# Patient Record
Sex: Female | Born: 1980 | Race: White | Hispanic: Yes | Marital: Married | Smoking: Former smoker
Health system: Southern US, Community
[De-identification: ages and names within clinical notes are randomized; demographics above are authoritative.]

## PROBLEM LIST (undated history)

## (undated) DIAGNOSIS — J45909 Unspecified asthma, uncomplicated: Secondary | ICD-10-CM

## (undated) DIAGNOSIS — Z9109 Other allergy status, other than to drugs and biological substances: Secondary | ICD-10-CM

## (undated) HISTORY — DX: Unspecified asthma, uncomplicated: J45.909

## (undated) HISTORY — DX: Other allergy status, other than to drugs and biological substances: Z91.09

---

## 2014-03-08 ENCOUNTER — Institutional Professional Consult (permissible substitution): Payer: Self-pay | Admitting: Internal Medicine

## 2014-04-08 ENCOUNTER — Institutional Professional Consult (permissible substitution): Payer: Medicare HMO | Admitting: Internal Medicine

## 2014-05-09 ENCOUNTER — Other Ambulatory Visit (INDEPENDENT_AMBULATORY_CARE_PROVIDER_SITE_OTHER): Payer: Medicare HMO

## 2014-05-09 ENCOUNTER — Ambulatory Visit (INDEPENDENT_AMBULATORY_CARE_PROVIDER_SITE_OTHER): Payer: Medicare HMO | Admitting: Internal Medicine

## 2014-05-09 ENCOUNTER — Encounter (INDEPENDENT_AMBULATORY_CARE_PROVIDER_SITE_OTHER): Payer: Self-pay

## 2014-05-09 VITALS — BP 120/76 | HR 71 | Temp 98.4°F | Ht 66.0 in | Wt 211.8 lb

## 2014-05-09 DIAGNOSIS — J4530 Mild persistent asthma, uncomplicated: Secondary | ICD-10-CM

## 2014-05-09 DIAGNOSIS — R635 Abnormal weight gain: Secondary | ICD-10-CM

## 2014-05-09 DIAGNOSIS — J45909 Unspecified asthma, uncomplicated: Secondary | ICD-10-CM | POA: Insufficient documentation

## 2014-05-09 LAB — CBC WITH DIFFERENTIAL/PLATELET
Basophils Absolute: 0 10*3/uL (ref 0.0–0.1)
Basophils Relative: 0.6 % (ref 0.0–3.0)
EOS ABS: 0.1 10*3/uL (ref 0.0–0.7)
Eosinophils Relative: 1.8 % (ref 0.0–5.0)
HCT: 38.6 % (ref 36.0–46.0)
HEMOGLOBIN: 13 g/dL (ref 12.0–15.0)
Lymphocytes Relative: 33 % (ref 12.0–46.0)
Lymphs Abs: 2.1 10*3/uL (ref 0.7–4.0)
MCHC: 33.8 g/dL (ref 30.0–36.0)
MCV: 87 fl (ref 78.0–100.0)
MONO ABS: 0.4 10*3/uL (ref 0.1–1.0)
Monocytes Relative: 6.9 % (ref 3.0–12.0)
NEUTROS ABS: 3.7 10*3/uL (ref 1.4–7.7)
NEUTROS PCT: 57.7 % (ref 43.0–77.0)
Platelets: 238 10*3/uL (ref 150.0–400.0)
RBC: 4.43 Mil/uL (ref 3.87–5.11)
RDW: 13 % (ref 11.5–15.5)
WBC: 6.4 10*3/uL (ref 4.0–10.5)

## 2014-05-09 LAB — TSH: TSH: 1.26 u[IU]/mL (ref 0.35–4.50)

## 2014-05-09 MED ORDER — BUDESONIDE-FORMOTEROL FUMARATE 80-4.5 MCG/ACT IN AERO: INHALATION_SPRAY | RESPIRATORY_TRACT | Status: DC

## 2014-05-09 NOTE — Patient Instructions (Signed)
Try symbicort 80 2puffs each am and repeat in 12 hours if any symptoms at all  Work on inhaler technique:  relax and gently blow all the way out then take a nice smooth deep breath back in, triggering the inhaler at same time you start breathing in.  Hold for up to 5 seconds if you can.  Rinse and gargle with water when done     Weight control is simply a matter of calorie balance which needs to be tilted in your favor by eating less and exercising more.  To get the most out of exercise, you need to be continuously aware that you are short of breath, but never out of breath, for 30 minutes daily. As you improve, it will actually be easier for you to do the same amount of exercise  in  30 minutes so always push to the level where you are short of breath.  If this does not result in gradual weight reduction then I strongly recommend you see a nutritionist with a food diary x 2 weeks so that we can work out a negative calorie balance which is universally effective in steady weight loss programs.  Think of your calorie balance like you do your bank account where in this case you want the balance to go down so you must take in less calories than you burn up.  It's just that simple:  Hard to do, but easy to understand.  Good luck!   Please schedule a follow up visit in 3 months but call sooner if needed

## 2014-05-09 NOTE — Progress Notes (Signed)
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 with onset of asthma in 2012 assoc with exp to [employer] and pos allergy testing by  ent "allergic asthma" =allergic to [employer] per her hx > Dr Orson AloeHenderson eval rx clonazepam and symbicort 160 2 each am but self -referred 05/09/2014 to pulmonary clinic for asthma management no longer using clonazepam   05/09/2014 [facility]st [facility] office visit/ Erica Ortega re asthma well controlled and no need for rescue saba recently   Not limited by breathing from desired activities  But not aerobic and concerned re wt gain   No obvious day to day or daytime variabilty or assoc chronic cough or cp or chest tightness, subjective wheeze overt sinus or hb symptoms. No unusual exp hx or h/o childhood pna/ asthma or knowledge of premature birth.  Sleeping ok without nocturnal  or early am exacerbation  of respiratory  c/o's or need for noct saba. Also denies any obvious fluctuation of symptoms with weather or environmental changes or other aggravating or alleviating factors except as outlined above   Current Medications, Allergies, Complete Past Medical History, Past Surgical History, Family History, and Social History were reviewed in [facility] electronic medical record.  ROS  The following are not active complaints unless bolded sore throat, dysphagia, dental problems, itching, sneezing,  nasal congestion or excess/ purulent secretions, ear ache,   fever, chills, sweats, unintended wt loss, pleuritic or exertional cp, hemoptysis,  orthopnea pnd or leg swelling, presyncope, palpitations, heartburn, abdominal pain, anorexia, nausea, vomiting, diarrhea  or change in bowel or urinary habits, change in stools or urine, dysuria,hematuria,  rash, arthralgias, visual complaints, headache, numbness weakness or  ataxia or problems with walking or coordination,  change in mood/affect or memory.                Review of Systems     Objective:   Physical Exam     Wt Readings from Last 3 Encounters:  05/09/14 211 lb 12.8 oz (96.072 kg)       HEENT: nl dentition, turbinates, and orophanx. Nl external ear canals without cough reflex   NECK :  without JVD/Nodes/TM/ nl carotid upstrokes bilaterally   LUNGS: no acc muscle use, clear to A and P bilaterally without cough on insp or exp maneuvers   CV:  RRR  no s3 or murmur or increase in P2, no edema   ABD:  soft and nontender with nl excursion in the supine position. No bruits or organomegaly, bowel sounds nl  MS:  warm without deformities, calf tenderness, cyanosis or clubbing  SKIN: warm and dry without lesions    NEURO:  alert, approp, no deficits   Lab Results  Component Value Date   WBC 6.4 05/09/2014   HGB 13.0 05/09/2014   HCT 38.6 05/09/2014   MCV 87.0 05/09/2014   PLT 238.0 05/09/2014    Lab Results  Component Value Date   TSH 1.26 05/09/2014      Assessment:

## 2014-05-10 ENCOUNTER — Encounter: Payer: Self-pay | Admitting: Internal Medicine

## 2014-05-10 NOTE — Assessment & Plan Note (Signed)
Calorie balance issues reviewed in detail See instructions for specific recommendations which were reviewed directly with the patient who was given a copy with highlighter outlining the key components.  

## 2014-05-10 NOTE — Progress Notes (Signed)
Quick Note:  LMTCB ______ 

## 2014-05-10 NOTE — Assessment & Plan Note (Addendum)
All goals of chronic asthma control met including optimal function and elimination of symptoms with minimal need for rescue therapy.  Contingencies discussed in full including contacting this office immediately if not controlling the symptoms using the rule of two's.     rec step down next refill to symbicort 80 2bid  Note h/o anxiety overlapping with asthma very difficult to sort out by hx but for now do not rec clonazepam 

## 2014-05-16 ENCOUNTER — Telehealth: Payer: Self-pay | Admitting: Internal Medicine

## 2014-05-16 NOTE — Telephone Encounter (Signed)
Call patient : Studies are unremarkable, no change in recs  [facility]

## 2014-05-16 NOTE — Progress Notes (Signed)
Quick Note:  LMTCB ______ 

## 2014-06-20 ENCOUNTER — Telehealth: Payer: Self-pay | Admitting: Internal Medicine

## 2014-06-20 NOTE — Telephone Encounter (Signed)
lmomtcb x1 

## 2014-06-21 NOTE — Telephone Encounter (Signed)
lmtcb for pt.  

## 2014-06-22 NOTE — Telephone Encounter (Signed)
lmtcb for pt.   Rx for symbicort was sent to pharmacy on 05/09/14 with 12 refills.

## 2014-06-24 NOTE — Telephone Encounter (Signed)
lmtcb for pt Called work # and closed

## 2014-06-28 NOTE — Telephone Encounter (Signed)
Spoke with pt and she states she found a paper rx for Symbicort but she states insurance will now not cover this.  She will contact insurance to see what they will cover in place of this and call us back.

## 2014-08-01 ENCOUNTER — Telehealth: Payer: Self-pay | Admitting: Internal Medicine

## 2014-08-01 NOTE — Telephone Encounter (Signed)
We do not have any samples at this time. Pt is aware.

## 2014-08-02 ENCOUNTER — Telehealth: Payer: Self-pay | Admitting: Internal Medicine

## 2014-08-02 NOTE — Telephone Encounter (Addendum)
Spoke with pt. Apologized for any inconveince but we do have samples of Symbicort 80.

## 2014-08-02 NOTE — Telephone Encounter (Signed)
Pt was able to receive Symbicort 80 samples. Nothing further needed.

## 2014-08-02 NOTE — Telephone Encounter (Signed)
Yes on my desk

## 2014-08-02 NOTE — Telephone Encounter (Signed)
Please call her soon so she will have time to drive her to get whatever.

## 2014-08-15 ENCOUNTER — Ambulatory Visit: Payer: Medicare HMO | Admitting: Internal Medicine

## 2014-09-09 ENCOUNTER — Encounter (INDEPENDENT_AMBULATORY_CARE_PROVIDER_SITE_OTHER): Payer: Self-pay

## 2014-09-09 ENCOUNTER — Encounter: Payer: Self-pay | Admitting: Internal Medicine

## 2014-09-09 ENCOUNTER — Ambulatory Visit (INDEPENDENT_AMBULATORY_CARE_PROVIDER_SITE_OTHER): Payer: Medicare HMO | Admitting: Internal Medicine

## 2014-09-09 VITALS — BP 112/80 | HR 83 | Ht 66.0 in | Wt 210.0 lb

## 2014-09-09 DIAGNOSIS — J4530 Mild persistent asthma, uncomplicated: Secondary | ICD-10-CM

## 2014-09-09 NOTE — Assessment & Plan Note (Signed)
The proper method of use, as well as anticipated side effects, of a metered-dose inhaler are discussed and demonstrated to the patient. Improved effectiveness after extensive coaching during this visit to a level of approximately  75%   Despite sub optimal hfa >>>All goals of chronic asthma control met including optimal function and elimination of symptoms with minimal need for rescue therapy.  Contingencies discussed in full including contacting this office immediately if not controlling the symptoms using the rule of two's.

## 2014-09-09 NOTE — Patient Instructions (Signed)
symbiocrt 80 2 pffs each am and pm dose is as needed   If you are satisfied with your treatment plan,  let your doctor know and he/she can either refill your medications or you can return here when your prescription runs out.     If in any way you are not 100% satisfied,  please tell us.  If 100% better, tell your friends!  Pulmonary follow up is as needed

## 2014-09-09 NOTE — Progress Notes (Signed)
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 with onset of asthma in 2012 assoc with exp to [employer] and pos allergy testing by  ent "allergic asthma" =allergic to [employer] per her hx > Dr Orson AloeHenderson eval rx clonazepam and symbicort 160 2 each am but self -referred 05/09/2014 to pulmonary clinic for asthma management no longer using clonazepam   History of Present Illness  05/09/2014 1st [facility] office visit/ Ignazio Kincaid re asthma well controlled and no need for rescue saba recently  rec symbicort 80 2bid and work on hfa technique   09/09/2014 f/u ov/Edras Wilford re: mild asthma/ all smiles on symbicort 80 2qam though hfa still not optimal  Chief Complaint  Patient presents with  . Follow-up    Pt states that her breathing is doing well. She has been using symbicort 2 puffs every am.    no need for rescue at all  Not limited by breathing from desired activities    No obvious day to day or daytime variabilty or assoc chronic cough or cp or chest tightness, subjective wheeze overt sinus or hb symptoms. No unusual exp hx or h/o childhood pna/ asthma or knowledge of premature birth.  Sleeping ok without nocturnal  or early am exacerbation  of respiratory  c/o's or need for noct saba. Also denies any obvious fluctuation of symptoms with weather or environmental changes or other aggravating or alleviating factors except as outlined above   Current Medications, Allergies, Complete Past Medical History, Past Surgical History, Family History, and Social History were reviewed in [facility] electronic medical record.  ROS  The following are not active complaints unless bolded sore throat, dysphagia, dental problems, itching, sneezing,  nasal congestion or excess/ purulent secretions, ear ache,   fever, chills,  sweats, unintended wt loss, pleuritic or exertional cp, hemoptysis,  orthopnea pnd or leg swelling, presyncope, palpitations, heartburn, abdominal pain, anorexia, nausea, vomiting, diarrhea  or change in bowel or urinary habits, change in stools or urine, dysuria,hematuria,  rash, arthralgias, visual complaints, headache, numbness weakness or ataxia or problems with walking or coordination,  change in mood/affect or memory.              Objective:   Physical Exam    amb obese wf nad   Wt Readings from Last 3 Encounters:  09/09/14 210 lb (95.255 kg)  05/09/14 211 lb 12.8 oz (96.072 kg)    Vital signs reviewed   HEENT: nl dentition, turbinates, and orophanx. Nl external ear canals without cough reflex   NECK :  without JVD/Nodes/TM/ nl carotid upstrokes bilaterally   LUNGS: no acc muscle use, clear to A and P bilaterally without cough on insp or exp maneuvers   CV:  RRR  no s3 or murmur or increase in P2, no edema   ABD:  soft and nontender with nl excursion in the supine position. No bruits or organomegaly, bowel sounds nl  MS:  warm without deformities, calf tenderness, cyanosis or clubbing  SKIN: warm and dry without lesions         Assessment:

## 2015-08-01 ENCOUNTER — Telehealth: Payer: Self-pay | Admitting: Internal Medicine

## 2015-08-01 ENCOUNTER — Other Ambulatory Visit: Payer: Self-pay | Admitting: Internal Medicine

## 2015-08-01 MED ORDER — BUDESONIDE-FORMOTEROL FUMARATE 80-4.5 MCG/ACT IN AERO: INHALATION_SPRAY | RESPIRATORY_TRACT | Status: DC

## 2015-08-01 NOTE — Telephone Encounter (Signed)
Patient calling to get refill on Symbicort. Rx sent to pharmacy. Patient notified. Nothing further needed.

## 2016-05-16 ENCOUNTER — Encounter: Payer: Self-pay | Admitting: Internal Medicine

## 2016-05-16 DIAGNOSIS — I2699 Other pulmonary embolism without acute cor pulmonale: Secondary | ICD-10-CM | POA: Insufficient documentation

## 2016-05-27 ENCOUNTER — Encounter: Payer: Self-pay | Admitting: Internal Medicine

## 2016-05-30 ENCOUNTER — Encounter: Payer: Self-pay | Admitting: Internal Medicine

## 2016-05-30 ENCOUNTER — Ambulatory Visit (INDEPENDENT_AMBULATORY_CARE_PROVIDER_SITE_OTHER): Payer: Managed Care, Other (non HMO) | Admitting: Internal Medicine

## 2016-05-30 VITALS — BP 126/74 | HR 75 | Ht 67.0 in | Wt 220.6 lb

## 2016-05-30 DIAGNOSIS — J4530 Mild persistent asthma, uncomplicated: Secondary | ICD-10-CM

## 2016-05-30 DIAGNOSIS — I2699 Other pulmonary embolism without acute cor pulmonale: Secondary | ICD-10-CM

## 2016-05-30 MED ORDER — BUDESONIDE-FORMOTEROL FUMARATE 80-4.5 MCG/ACT IN AERO: 2.0000 | INHALATION_SPRAY | Freq: Two times a day (BID) | RESPIRATORY_TRACT | 0 refills | Status: DC

## 2016-05-30 MED ORDER — APIXABAN 5 MG PO TABS: 5.0000 mg | ORAL_TABLET | Freq: Two times a day (BID) | ORAL | 0 refills | Status: DC

## 2016-05-30 NOTE — Progress Notes (Signed)
   Subjective:    Patient ID: Erica Ortega, female    DOB: 08/12/1980, 35 y.o.   MRN: [id number]  HPI    Review of Systems  Constitutional: Negative for chills, fever and unexpected weight change.  HENT: Negative for congestion, dental problem, ear pain, nosebleeds, postnasal drip, rhinorrhea, sinus pressure, sneezing, sore throat, trouble swallowing and voice change.   Eyes: Negative for visual disturbance.  Respiratory: Negative for cough, choking and shortness of breath.   Cardiovascular: Negative for chest pain and leg swelling.  Gastrointestinal: Negative for abdominal pain, diarrhea and vomiting.  Genitourinary: Negative for difficulty urinating.  Musculoskeletal: Negative for arthralgias.  Skin: Negative for rash.  Neurological: Negative for tremors, syncope and headaches.  Hematological: Does not bruise/bleed easily.       Objective:   Physical Exam        Assessment & Plan:

## 2016-05-30 NOTE — Assessment & Plan Note (Signed)
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 ([place]) > no echo or venous dopplers  - pos fm hx mother with filter   All symptoms (L sided pain, new sob) resolved on eliquis even though the PE's on CTa were on the R   I had an extended discussion with the patient reviewing all relevant studies completed to date and  lasting 25 minutes of a 40  minute extended post ER visit  To discuss new dx of PE  1) natural hx / risk factors reviewed  2) as long as doe back to baseline nothing else needed for now  3) needs min of 6 months based on risk factors of obesity and pos fm hx   4) p 6 months rx consider change over to low dose eliquis indefinitely if echo/ venous dopplers neg, if either is abn needs another 6 m rx   5) alternatively consider refer to [facility] if above studies are neg and she wants to try off as the greatest risk factor for dvt/pe is a h/o dvt/pe esp in the unproked setting as this was   Each maintenance medication was reviewed in detail including most importantly the difference between maintenance and prns and under what circumstances the prns are to be triggered using an action plan format that is not reflected in the computer generated alphabetically organized AVS.    Please see instructions for details which were reviewed in writing and the patient given a copy highlighting the part that I personally wrote and discussed at today's ov.

## 2016-05-30 NOTE — Progress Notes (Signed)
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 of asthma in 2012 assoc with exp to [employer] and pos allergy testing by  ent "allergic asthma" =allergic to [employer] per her hx > Dr Orson AloeHenderson eval rx clonazepam and symbicort 160 2 each am but self -referred 05/09/2014 to pulmonary clinic for asthma management no longer using clonazepam   History of Present Illness  05/09/2014 1st [facility] office visit/ Jack Bolio re asthma well controlled and no need for rescue saba recently  rec symbicort 80 2bid and work on hfa technique   09/09/2014 f/u ov/Riko Lumsden re: mild asthma/ all smiles on symbicort 80 2qam though hfa still not optimal  Chief Complaint  Patient presents with  . Follow-up    Pt states that her breathing is doing well. She has been using symbicort 2 puffs every am.    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 Pulmonary Consult    Self referral for recent PE. Pt states that she was that she was dxed with PE on 10/11/7. She     prior to the event on Oct 10 breathing was fine and didn't sense the need for  the second (pm) dose of symbicort    Abrupt onset sob/ L pleuritic cp while driving short distances only on 05/07/16  > [facility] [place] Sided clots on PE did not do venous dopplers> eliquis > much better now  Not limited by breathing from desired activities    No obvious day to day or daytime variability or assoc excess/ purulent sputum or mucus plugs or hemoptysis or cp or chest tightness, subjective  wheeze or overt sinus or hb symptoms. No unusual exp hx or h/o childhood pna/ asthma or knowledge of premature birth.  Sleeping ok without nocturnal  or early am exacerbation  of respiratory  c/o's or need for noct saba. Also denies any obvious fluctuation of symptoms with weather or environmental changes or other aggravating or alleviating factors except as outlined above   Current Medications, Allergies, Complete Past Medical History, Past Surgical History, Family History, and Social History were reviewed in [facility] electronic medical record.  ROS  The following are not active complaints unless bolded sore throat, dysphagia, dental problems, itching, sneezing,  nasal congestion or excess/ purulent secretions, ear ache,   fever, chills, sweats, unintended wt loss, classically pleuritic or exertional cp,  orthopnea pnd or leg swelling, presyncope, palpitations, abdominal pain, anorexia, nausea, vomiting, diarrhea  or change in bowel or bladder habits, change in stools or urine, dysuria,hematuria,  rash, arthralgias, visual complaints, headache, numbness, weakness or ataxia or problems with walking or coordination,  change in mood/affect or memory.                   Objective:   Physical Exam    amb obese wf nad   05/30/2016 :  09/09/14 210 lb (95.255 kg)  05/09/14 211 lb  12.8 oz (96.072 kg)    Vital signs reviewed - Note on arrival 02 sats  98% on RA    HEENT: nl dentition, turbinates, and orophanx. Nl external ear canals without cough reflex   NECK :  without JVD/Nodes/TM/ nl carotid upstrokes bilaterally   LUNGS: no acc muscle use, clear to A and P bilaterally without cough on insp or exp maneuvers   CV:  RRR  no s3 or murmur or increase in P2, no edema   ABD:  soft and nontender with nl excursion in the supine position. No bruits or organomegaly, bowel sounds nl  MS:  warm without deformities, calf tenderness, cyanosis or clubbing  SKIN: warm and dry without  lesions         Assessment:

## 2016-05-30 NOTE — Patient Instructions (Signed)
Continue eliquis 5 mg twice daily x 6 month then you would need a venous doppler and echocardiogram and if normal then the dose 2.5 mg twice indefinitely   If it is stopped for any reason,  You hypercoagulability profile  (factor V leiden deficiency is the most common)   Pulmonary follow up is as needed    If you are satisfied with your treatment plan,  let your doctor know and he/she can either refill your medications or you can return here when your prescription runs out.     If in any way you are not 100% satisfied,  please tell us.  If 100% better, tell your friends!  Pulmonary follow up is as needed    Copy to Jonathon Bellow[place]achel McGee [place]

## 2016-05-30 NOTE — Assessment & Plan Note (Signed)
All goals of chronic asthma control met including optimal function and elimination of symptoms with minimal need for rescue therapy.  Contingencies discussed in full including contacting this office immediately if not controlling the symptoms using the rule of two's.    

## 2016-08-15 ENCOUNTER — Other Ambulatory Visit: Payer: Self-pay | Admitting: Internal Medicine

## 2016-10-21 ENCOUNTER — Telehealth: Payer: Self-pay | Admitting: Internal Medicine

## 2016-10-21 NOTE — Telephone Encounter (Signed)
Spoke with pt, states that MW discussed doing a genetic testing to test for increased risk for blood clots.  Pt would like to do this test now.  Pt is seeing her PCP next week and would like this test done before then.  MW please advise on what labs need to be ordered.  Thanks!   05/30/16 AVS:  Patient Instructions   Continue eliquis 5 mg twice daily x 6 month then you would need a venous doppler and echocardiogram and if normal then the dose 2.5 mg twice indefinitely    If it is stopped for any reason,  You hypercoagulability profile  (factor V leiden deficiency is the most common)    Pulmonary follow up is as needed     If you are satisfied with your treatment plan,  let your doctor know and he/she can either refill your medications or you can return here when your prescription runs out.      If in any way you are not 100% satisfied,  please tell us.  If 100% better, tell your friends!   Pulmonary follow up is as needed     Copy to Jonathon Bellowsachel McGee in [place]

## 2016-10-21 NOTE — Telephone Encounter (Signed)
atc pt on both home and work # listed, both rang to work [employer] mails.  Will call back.

## 2016-10-21 NOTE — Telephone Encounter (Signed)
She can do hypercogulable profile but should be off the eliquis for 2 doses prior to blood draw then immediately restart

## 2016-10-22 NOTE — Telephone Encounter (Signed)
Contacted pt back on number in previous message, unknown female stated pt was in a meeting until 12 to try to back then.

## 2016-10-22 NOTE — Telephone Encounter (Signed)
Patient is returning phone call. [phone]

## 2016-10-24 NOTE — Telephone Encounter (Signed)
Called and spoke with pt and she stated that MW advised her that he would send an order to her PCP so that she can have this lab done at their office.  This order has been done and faxed to her PCP.  Nothing further is needed.

## 2016-10-30 ENCOUNTER — Telehealth: Payer: Self-pay | Admitting: Internal Medicine

## 2016-10-30 NOTE — Telephone Encounter (Signed)
Spoke with [employer] about this  The hypercoaguable panel consists of:  Lupus anticoagulant eval with reflex  Beta 2 Glycoprotein 1 Anitgen  IgG  IgA  IgM Antithrombin 3 activity  Protein C antigen  Protein C activity  Protein S actitiy  Protein S antigen  Cardiolipin Antibodies- igG, igA, igM Prothrombin gene analysis  Homocystine  Factor 5 Leiden mutation   I called and spoke with Dr Renella Cunas office and nurse I spoke with rec that I write out orders and fax to her at [phone]. I have done this and placed orders in scan.

## 2016-11-07 ENCOUNTER — Telehealth: Payer: Self-pay | Admitting: Internal Medicine

## 2016-11-07 DIAGNOSIS — I2699 Other pulmonary embolism without acute cor pulmonale: Secondary | ICD-10-CM

## 2016-11-07 NOTE — Telephone Encounter (Signed)
Received hypercoag panel results done with PCP and given to MW to review  Please advise, thanks!

## 2016-11-07 NOTE — Telephone Encounter (Signed)
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

## 2016-11-08 NOTE — Telephone Encounter (Signed)
Spoke with pt and notified of results per Dr. Sherene Sires. Pt verbalized understanding and denied any questions. She wants to come here and also have the ECHO and VQ scan  Tests ordered

## 2016-11-25 ENCOUNTER — Telehealth: Payer: Self-pay | Admitting: *Deleted

## 2016-11-25 NOTE — Telephone Encounter (Signed)
Is there still a need for her to have the ECHO and VQ scan? She is scheduled for these and f/u here in May 2018  Please advise thanks

## 2016-11-25 NOTE — Telephone Encounter (Signed)
-----   Message from Nyoka Cowden, MD sent at 11/25/2016  8:53 AM EDT ----- Let her know I reviewed her blood work and see she has a Protein S def so definitely needs to stay on xarelto until she sees a specialist > rec Dr Cyndie Chime if her doctor did not already refer her to someone else and convert this into a phone note  Ok to cancel f/u with me if 100% back to baseline, otherwise keep appt

## 2016-11-25 NOTE — Telephone Encounter (Signed)
Spoke with pt and made her aware of MW's message. Pt understood and had no further questions. Nothing further is needed.

## 2016-11-25 NOTE — Telephone Encounter (Signed)
They were to explore why she clots, some of which are genetic but I'm not an expert in this and I will do the best I can when she returns to review them with her and  Refer her to a specialist then to go over them again - no change in rx in meantime is indicated

## 2016-11-25 NOTE — Telephone Encounter (Signed)
Yes let's do the f/u studies  And ov and at that visit will  refer to [facility]

## 2016-11-25 NOTE — Telephone Encounter (Signed)
Called and spoke with pt and she is aware of MW recs.  She will keep the appt in may.    Pt was requesting that MW let her know what all of these lab tests are checking for.  She has asked several times and has just been told that it is an extensive blood test.  Pt would like to know what each test if for.  MW please advise. Thanks  No Known Allergies

## 2016-12-20 ENCOUNTER — Encounter: Payer: Self-pay | Admitting: Internal Medicine

## 2016-12-20 ENCOUNTER — Encounter (INDEPENDENT_AMBULATORY_CARE_PROVIDER_SITE_OTHER): Payer: Self-pay

## 2016-12-20 ENCOUNTER — Ambulatory Visit (HOSPITAL_COMMUNITY)
Admission: RE | Admit: 2016-12-20 | Discharge: 2016-12-20 | Disposition: A | Discharge status: Home / Self Care | Payer: Managed Care, Other (non HMO) | Source: Ambulatory Visit | Attending: Internal Medicine | Admitting: Internal Medicine

## 2016-12-20 ENCOUNTER — Ambulatory Visit (INDEPENDENT_AMBULATORY_CARE_PROVIDER_SITE_OTHER): Payer: Managed Care, Other (non HMO) | Admitting: Internal Medicine

## 2016-12-20 VITALS — BP 102/62 | HR 105 | Ht 67.0 in | Wt 228.0 lb

## 2016-12-20 DIAGNOSIS — I2699 Other pulmonary embolism without acute cor pulmonale: Secondary | ICD-10-CM

## 2016-12-20 DIAGNOSIS — J4530 Mild persistent asthma, uncomplicated: Secondary | ICD-10-CM

## 2016-12-20 MED ORDER — TECHNETIUM TC 99M DIETHYLENETRIAME-PENTAACETIC ACID
32.1000 | Freq: Once | INTRAVENOUS | Status: AC | PRN
  Administered 2016-12-20: 32.1 via INTRAVENOUS

## 2016-12-20 MED ORDER — TECHNETIUM TO 99M ALBUMIN AGGREGATED
4.1000 | Freq: Once | INTRAVENOUS | Status: AC | PRN
  Administered 2016-12-20: 4.1 via INTRAVENOUS

## 2016-12-20 NOTE — Progress Notes (Signed)
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 with onset of asthma in 2012 assoc with exp to [place] pig and pos allergy testing by  ent "allergic asthma" =allergic to [place] pigs per her hx > Dr Orson Aloe eval rx clonazepam and symbicort 160 2 each am but self -referred 05/09/2014 to pulmonary clinic for asthma management no longer using clonazepam/ note dx PE 04/2016 while on bcp's and Pos Protein S def    History of Present Illness  05/09/2014 1st [facility] office visit/ Carlene Bickley re asthma well controlled and no need for rescue saba recently  rec symbicort 80 2bid and work on hfa technique   09/09/2014 f/u ov/Tre Sanker re: mild asthma/ all smiles on symbicort 80 2qam though hfa still not optimal  Chief Complaint  Patient presents with  . Follow-up    Pt states that her breathing is doing well. She has been using symbicort 2 puffs every am.    no need for rescue at all Not limited by breathing from desired activities   rec symbiocrt 80 2 pffs each am and pm dose is as needed   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 [facility]) while on bcps x 3 month     05/30/2016  f/u ov/Braedyn Riggle re:  S/p PE oct 04/2016 with neg risk factors x mother has filter   Chief Complaint  Patient presents with  . Pulmonary Consult    Self referral for recent PE. Pt states that she was that she was dxed with PE on 10/11/7. She   prior to the event on Oct 10 breathing was fine and didn't sense the need for  the second (pm) dose of symbicort Abrupt onset sob/ L pleuritic cp while driving short distances only on 05/07/16  > to [facility] ER with R Sided clots on PE did not do venous dopplers> eliquis > much better now rec Continue eliquis 5 mg twice daily x 6 month then you would need a venous doppler and echocardiogram and if normal then the  dose 2.5 mg twice indefinitely  If it is stopped for any reason,  You hypercoagulability profile  (factor V leiden deficiency is the most common)         12/20/2016  f/u ov/Laira Penninger re:  S/p PE/ asthma/ MO Chief Complaint  Patient presents with  . Follow-up    f/u PE, pt reports she is doing well      Not limited by breathing from desired activities  / no need for saba   No obvious day to day or daytime variability or assoc excess/ purulent sputum or mucus plugs or hemoptysis or cp or chest tightness, subjective wheeze or overt sinus or hb symptoms. No unusual exp hx or h/o childhood pna/ asthma or knowledge of premature birth.  Sleeping ok without nocturnal  or early am exacerbation  of respiratory  c/o's or need for noct saba. Also denies any obvious fluctuation of symptoms with weather or environmental changes or other aggravating or alleviating factors except as outlined above   Current Medications, Allergies, Complete Past Medical History, Past Surgical History, Family History, and Social History were reviewed in [employer] record.  ROS  The following are not active complaints unless bolded sore throat, dysphagia, dental problems, itching, sneezing,  nasal congestion or excess/  purulent secretions, ear ache,   fever, chills, sweats, unintended wt loss, classically pleuritic or exertional cp,  orthopnea pnd or leg swelling, presyncope, palpitations, abdominal pain, anorexia, nausea, vomiting, diarrhea  or change in bowel or bladder habits, change in stools or urine, dysuria,hematuria,  rash, arthralgias, visual complaints, headache, numbness, weakness or ataxia or problems with walking or coordination,  change in mood/affect or memory.              Objective:   Physical Exam    amb obese wf nad - all smiles   12/20/2016        228  05/30/2016        228  09/09/14 210 lb (95.255 kg)  05/09/14 211 lb 12.8 oz (96.072 kg)    Vital signs reviewed - Note on  arrival 02 sats  98% on RA    HEENT: nl dentition, turbinates, and orophanx. Nl external ear canals without cough reflex   NECK :  without JVD/Nodes/TM/ nl carotid upstrokes bilaterally   LUNGS: no acc muscle use, clear to A and P bilaterally without cough on insp or exp maneuvers   CV:  RRR  no s3 or murmur or increase in P2, no edema   ABD:  soft and nontender with nl excursion in the supine position. No bruits or organomegaly, bowel sounds nl  MS:  warm without deformities, calf tenderness, cyanosis or clubbing  SKIN: warm and dry without lesions      Labs reviewed:  hypercoagulobility profile scanned into EPIC done 10/30/16 -see a/p  - Echo 12/20/2016 nl - V/Q 12/21/2016 nl   Assessment:

## 2016-12-20 NOTE — Progress Notes (Signed)
Spoke with pt and notified of results per Dr. Wert. Pt verbalized understanding and denied any questions. 

## 2016-12-20 NOTE — Patient Instructions (Addendum)
Stay on Eliquis 5 mg twice daily   Please see patient coordinator before you leave today  to schedule venous dopplers and [facility] same day prior to January 26 2017    If you are satisfied with your treatment plan,  let your doctor know and he/she can either refill your medications or you can return here when your prescription runs out.     If in any way you are not 100% satisfied,  please tell us.  If 100% better, tell your friends!  Pulmonary follow up is as needed

## 2016-12-20 NOTE — Progress Notes (Signed)
  Echocardiogram 2D Echocardiogram has been performed.  Arvil ChacoFoster, Apostolos Blagg 12/20/2016, 10:51 AM

## 2016-12-21 ENCOUNTER — Encounter: Payer: Self-pay | Admitting: Internal Medicine

## 2016-12-21 NOTE — Assessment & Plan Note (Signed)
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 ([place]) > no echo or venous dopplers  - pos fm hx mother with filter  - Hypercoagulability profile done 11/13/16:   Protein S 41% (low)  Factor V mut neg  - Echo 12/20/2016 nl - V/Q 12/21/2016 nl  - Venous dopplers 12/20/2016   rec refer to [facility] 12/20/2016   Clearly 100% recovered though need venous dopplers to decide on maint rx with DOAC vs prophylactic doses.  In terms of whether safe to change to asa 325 mg daily, I would like Dr Patsy LagerGranfortuna's opinion - although the BCP's are gone the obesity and Protein S def remain def risk factor for recurrence  I had an extended discussion with the patient reviewing all relevant studies completed to date and  lasting 15 to 20 minutes of a 25 minute visit    Each maintenance medication was reviewed in detail including most importantly the difference between maintenance and prns and under what circumstances the prns are to be triggered using an action plan format that is not reflected in the computer generated alphabetically organized AVS.    Please see AVS for specific instructions unique to this visit that I personally wrote and verbalized to the the pt in detail and then reviewed with pt  by my nurse highlighting any  changes in therapy recommended at today's visit to their plan of care.

## 2016-12-21 NOTE — Assessment & Plan Note (Signed)
All goals of chronic asthma control met including optimal function and elimination of symptoms with minimal need for rescue therapy.  Contingencies discussed in full including contacting this office immediately if not controlling the symptoms using the rule of two's.    

## 2016-12-21 NOTE — Assessment & Plan Note (Signed)
Body mass index is 35.71 kg/m.  -  Trending no change  Lab Results  Component Value Date   TSH 1.26 05/09/2014     Contributing to DVT/PE recurrence risk - reviewed the need and the process to achieve and maintain neg calorie balance > defer f/u primary care including intermittently monitoring thyroid status

## 2017-01-03 ENCOUNTER — Ambulatory Visit (HOSPITAL_COMMUNITY)
Admission: RE | Admit: 2017-01-03 | Discharge: 2017-01-03 | Disposition: A | Discharge status: Home / Self Care | Payer: Managed Care, Other (non HMO) | Source: Ambulatory Visit | Attending: Cardiology | Admitting: Cardiology

## 2017-01-03 DIAGNOSIS — I2699 Other pulmonary embolism without acute cor pulmonale: Secondary | ICD-10-CM | POA: Insufficient documentation

## 2017-01-06 NOTE — Progress Notes (Signed)
LMTCB

## 2017-01-07 NOTE — Progress Notes (Signed)
Left message for patient to contact office. Letter asking patient to contact office is being sent to address on file.

## 2017-01-08 NOTE — Progress Notes (Signed)
Left message for patient to contact office.

## 2017-01-20 ENCOUNTER — Telehealth: Payer: Self-pay | Admitting: Internal Medicine

## 2017-01-20 NOTE — Telephone Encounter (Signed)
Called patient. She stated that she received a message from our office stating that we had tried to get in contact with her. Viewed patient's chart and saw where she had already been contacted about CXR results.   Did see where she had a doppler done on the 8th. Called patient back and relayed info. She verbalized understanding. Nothing further needed.

## 2017-02-04 ENCOUNTER — Encounter: Payer: Managed Care, Other (non HMO) | Admitting: Oncology

## 2017-03-18 ENCOUNTER — Telehealth: Payer: Self-pay | Admitting: Internal Medicine

## 2017-03-18 NOTE — Telephone Encounter (Signed)
PA initiated via [email or web address] for the symbicort  KEY:  [id number] Vanpatten 1981-02-24   Will forward to LR to follow up on PA.

## 2017-03-26 NOTE — Telephone Encounter (Signed)
PA was not required for the symbicort.  Nothing further is needed.

## 2017-04-02 ENCOUNTER — Telehealth: Payer: Self-pay | Admitting: Internal Medicine

## 2017-04-02 DIAGNOSIS — I2699 Other pulmonary embolism without acute cor pulmonale: Secondary | ICD-10-CM

## 2017-04-02 NOTE — Telephone Encounter (Signed)
Patient needs appt before 09/25 due to starting new job.

## 2017-04-02 NOTE — Telephone Encounter (Signed)
Called Dr. Patsy LagerGranfortuna's office. I have left a message for their referral coordinator to return our call. I have made the pt aware of this information. Will await call back.

## 2017-04-03 NOTE — Telephone Encounter (Signed)
Fine with me to [facility]

## 2017-04-03 NOTE — Telephone Encounter (Signed)
lmtcb X1 for pt. Referral to hematology placed.

## 2017-04-03 NOTE — Telephone Encounter (Signed)
Spoke with pt, states she has been unable to contact Dr. Patsy LagerGranfortuna's office.  Pt has never seen Dr. Cyndie ChimeGranfortuna to follow up on her PE since referral was placed on 5/25.2018. Pt is on blood thinners and is wanting to be seen by hematology soon to discuss coming off of these- wants to be referred to a different provider at this point d/t inability go contact Dr. Patsy LagerGranfortuna's office.  MW please advise if referral can be placed with another provider, and if so who.  Thanks.

## 2017-04-03 NOTE — Telephone Encounter (Signed)
Patient states Dr. Patsy LagerGranfortuna's office still has not contacted her for an appointment.  She states she is on blood thinners and if she needs to see another physician at another practice she would like us to refer her elsewhere.  CB is (cell) [phone].

## 2017-04-07 ENCOUNTER — Encounter: Payer: Self-pay | Admitting: *Deleted

## 2017-04-07 ENCOUNTER — Telehealth: Payer: Self-pay | Admitting: Internal Medicine

## 2017-04-07 NOTE — Telephone Encounter (Signed)
Called and spoke with pt. Pt calling in regards to oncology referral. Pt aware that referral was placed on 04/03/17.  Pt states she has not be contacted with an appointment and would like to been seen ASAP.  [facility]. Thanks.

## 2017-04-07 NOTE — Telephone Encounter (Signed)
See Erica Ortega's response below Syliva OvermanHerndon, Dana C, RN  Debbra RidingWalker, Anita S        Hey  I will update Sue LushAndrea, new patient coordinator on hematology referral. Thanks Erica HarmanDana

## 2017-04-07 NOTE — Progress Notes (Signed)
Oncology Nurse Navigator Documentation  Oncology Nurse Navigator Flowsheets 04/07/2017  Referral date to RadOnc/MedOnc 04/07/2017  Navigator Encounter Type Other/I received referral on Erica Ortega for hematology.  I do not coordinate hematology patient's. I updated new patient coordinator, Sue Lushndrea. She will schedule patient.   Barriers/Navigation Needs Coordination of Care  Interventions Coordination of Care  Coordination of Care Other  Acuity Level 2  Time Spent with Patient 15

## 2017-04-07 NOTE — Telephone Encounter (Signed)
I have called and lmom to make the pt aware of the referral to another provider for follow up.

## 2017-04-07 NOTE — Telephone Encounter (Signed)
Staff message sent to Willette Paana Herndon [facility] at [facility]

## 2017-04-08 ENCOUNTER — Telehealth: Payer: Self-pay | Admitting: Hematology and Oncology

## 2017-04-08 ENCOUNTER — Encounter: Payer: Self-pay | Admitting: Hematology and Oncology

## 2017-04-08 ENCOUNTER — Telehealth: Payer: Self-pay | Admitting: Internal Medicine

## 2017-04-08 NOTE — Telephone Encounter (Signed)
Sent staff message to oncology to see the status of the referral Erica Ortega

## 2017-04-08 NOTE — Telephone Encounter (Signed)
In the notes Willette PaDana Herndon has indicated she sent referral to new pt coordinator Sue Lushndrea.  I called Sue Lushndrea & left vm making her aware pt is waiting for phone call.  I called pt & made her aware Sue Lushndrea does have the referral.  Nothing further needed.

## 2017-04-08 NOTE — Telephone Encounter (Signed)
Appt has been scheduled for the pt to see Dr. Gweneth DimitriPerlov on 10/1 at 11am. Lft vm for the pt to call me back. Letter mailed with the appt date and time.

## 2017-04-08 NOTE — Telephone Encounter (Signed)
PCC's please advise on the new referral that was placed for the pt to be set up with hematology over at the cancer center.  Pt stated that she still has not heard back from anyone about an appt. thanks

## 2017-04-21 ENCOUNTER — Ambulatory Visit (HOSPITAL_BASED_OUTPATIENT_CLINIC_OR_DEPARTMENT_OTHER): Payer: 59

## 2017-04-21 ENCOUNTER — Telehealth: Payer: Self-pay | Admitting: Hematology and Oncology

## 2017-04-21 ENCOUNTER — Ambulatory Visit (HOSPITAL_BASED_OUTPATIENT_CLINIC_OR_DEPARTMENT_OTHER): Payer: 59 | Admitting: Hematology and Oncology

## 2017-04-21 VITALS — BP 117/53 | HR 88 | Temp 98.4°F | Resp 18 | Ht 67.0 in | Wt 227.1 lb

## 2017-04-21 DIAGNOSIS — Z86711 Personal history of pulmonary embolism: Secondary | ICD-10-CM

## 2017-04-21 DIAGNOSIS — Z86718 Personal history of other venous thrombosis and embolism: Secondary | ICD-10-CM | POA: Diagnosis not present

## 2017-04-21 DIAGNOSIS — D6852 Prothrombin gene mutation: Secondary | ICD-10-CM

## 2017-04-21 DIAGNOSIS — Z7901 Long term (current) use of anticoagulants: Secondary | ICD-10-CM

## 2017-04-21 DIAGNOSIS — D6859 Other primary thrombophilia: Secondary | ICD-10-CM

## 2017-04-21 LAB — CBC WITH DIFFERENTIAL/PLATELET
BASO%: 0.4 % (ref 0.0–2.0)
Basophils Absolute: 0 10*3/uL (ref 0.0–0.1)
EOS%: 1.5 % (ref 0.0–7.0)
Eosinophils Absolute: 0.1 10*3/uL (ref 0.0–0.5)
HEMATOCRIT: 40.3 % (ref 34.8–46.6)
HEMOGLOBIN: 13.7 g/dL (ref 11.6–15.9)
LYMPH%: 29.7 % (ref 14.0–49.7)
MCH: 29.5 pg (ref 25.1–34.0)
MCHC: 34 g/dL (ref 31.5–36.0)
MCV: 86.9 fL (ref 79.5–101.0)
MONO#: 0.4 10*3/uL (ref 0.1–0.9)
MONO%: 5.2 % (ref 0.0–14.0)
NEUT#: 5.2 10*3/uL (ref 1.5–6.5)
NEUT%: 63.2 % (ref 38.4–76.8)
Platelets: 228 10*3/uL (ref 145–400)
RBC: 4.64 10*6/uL (ref 3.70–5.45)
RDW: 12.9 % (ref 11.2–14.5)
WBC: 8.2 10*3/uL (ref 3.9–10.3)
lymph#: 2.4 10*3/uL (ref 0.9–3.3)

## 2017-04-21 LAB — COMPREHENSIVE METABOLIC PANEL
ALBUMIN: 3.9 g/dL (ref 3.5–5.0)
ALK PHOS: 63 U/L (ref 40–150)
ALT: 20 U/L (ref 0–55)
AST: 13 U/L (ref 5–34)
Anion Gap: 9 mEq/L (ref 3–11)
BILIRUBIN TOTAL: 0.82 mg/dL (ref 0.20–1.20)
BUN: 8 mg/dL (ref 7.0–26.0)
CALCIUM: 9.4 mg/dL (ref 8.4–10.4)
CO2: 26 mEq/L (ref 22–29)
Chloride: 105 mEq/L (ref 98–109)
Creatinine: 0.7 mg/dL (ref 0.6–1.1)
EGFR: >90 mL/min/{1.73_m2} (ref >90)
Glucose: 85 mg/dl (ref 70–140)
POTASSIUM: 4 meq/L (ref 3.5–5.1)
Sodium: 140 mEq/L (ref 136–145)
TOTAL PROTEIN: 7.8 g/dL (ref 6.4–8.3)

## 2017-04-21 MED ORDER — APIXABAN 5 MG PO TABS: 5.0000 mg | ORAL_TABLET | Freq: Two times a day (BID) | ORAL | 3 refills | Status: DC

## 2017-04-21 NOTE — Telephone Encounter (Signed)
Gave patient avs report and appointments for March 2019.  °

## 2017-04-23 LAB — CARDIOLIPIN ANTIBODIES, IGG, IGM, IGA
Anticardiolipin Ab,IgA,Qn: <9 U/mL (ref 0–11)
Anticardiolipin Ab,IgG,Qn: <9 GPL U/mL (ref 0–14)
Anticardiolipin Ab,IgM,Qn: 10 [MPL'U]/mL (ref 0–12)

## 2017-04-23 LAB — BETA-2-GLYCOPROTEIN I ABS, IGG/M/A
Beta-2 Glyco 1 IgA: <9 GPI IgA units (ref 0–25)
Beta-2 Glyco 1 IgM: <9 GPI IgM units (ref 0–32)
Beta-2 Glycoprotein I Ab, IgG: <9 GPI IgG units (ref 0–20)

## 2017-04-24 ENCOUNTER — Encounter: Payer: Self-pay | Admitting: Hematology and Oncology

## 2017-04-24 DIAGNOSIS — D6852 Prothrombin gene mutation: Secondary | ICD-10-CM | POA: Insufficient documentation

## 2017-04-24 DIAGNOSIS — Z86718 Personal history of other venous thrombosis and embolism: Secondary | ICD-10-CM | POA: Insufficient documentation

## 2017-04-24 DIAGNOSIS — D6859 Other primary thrombophilia: Secondary | ICD-10-CM | POA: Insufficient documentation

## 2017-04-24 NOTE — Assessment & Plan Note (Signed)
36 y.o. 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 The event that potential provocation by a protracted travel with induced immobility in June 2017, but connection is somewhat tenuous. She was initially treated with Apixaban (Eliquis) therapy with complete resolution of previous symptoms. 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 Prothrombin gene mutation by itself places the patient at 2-3 times higher risk of clotting. In addition off the protein S deficiency makes recurrent thrombosis highly likely. With that in mind, I do recommend patient to remain on anticoagulation indefinitely. 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 The testing will need to be repeated as this may raise possibility of antiphospholipid antibody syndrome presence and workup for possible systemic lupus will need to be undertaken.  Plan: --Labs today --Continue Apixaban (Eliquis) indefinitely due to risk of recurrent clotting --RTC 6 months for clinical monitioring

## 2017-04-24 NOTE — Progress Notes (Signed)
[facility] Cancer New Visit:  Assessment: Personal history of venous thrombosis and embolism 36 y.o. 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 The event that potential provocation by a protracted travel with induced immobility in June 2017, but connection is somewhat tenuous. She was initially treated with Apixaban (Eliquis) therapy with complete resolution of previous symptoms. 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 Prothrombin gene mutation by itself places the patient at 2-3 times higher risk of clotting. In addition off the protein S deficiency makes recurrent thrombosis highly likely. With that in mind, I do recommend patient to remain on anticoagulation indefinitely. 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 The testing will need to be repeated as this may raise possibility of antiphospholipid antibody syndrome presence and workup for possible systemic lupus will need to be undertaken.  Plan: --Labs today --Continue Apixaban (Eliquis) indefinitely due to risk of recurrent clotting --RTC 6 months for clinical monitioring  Voice recognition software was used and creation of this note. Despite my best effort at editing the text, some misspelling/errors may have occurred.  Orders Placed This Encounter  Procedures  . CBC with Differential    Standing Status:   Future    Number of Occurrences:   1    Standing Expiration Date:   04/21/2018  . Comprehensive metabolic panel    Standing Status:   Future    Number of Occurrences:   1    Standing Expiration Date:   04/21/2018  . Lupus anticoagulant panel*    Standing Status:   Future    Number of Occurrences:   1    Standing Expiration Date:   04/21/2018  .  Beta-2-glycoprotein i abs, IgG/M/A    Standing Status:   Future    Standing Expiration Date:   04/21/2018  . Cardiolipin antibodies, IgG, IgM, IgA*    Standing Status:   Future    Standing Expiration Date:   04/21/2018  . Lupus anticoagulant panel*    Standing Status:   Future    Standing Expiration Date:   04/21/2018    All questions were answered.  . The patient knows to call the clinic with any problems, questions or concerns.  This note was electronically signed.    History of Presenting Illness Erica Ortega 36 y.o. presenting [facility] for Recommendations regarding anticoagulation management for her history DVT and PE. Patient initially presented with shortness of breath left-sided chest pain in October 2017 to emergency room in [place]. Imaging and time demonstrates presence of pulmonary emboli in the right upper lobe and right lower lobe of the lungs without evidence of right ventricular strain. Patient was started on Apixaban (Eliquis) leading to complete symptom resolution within a couple weeks. No ultrasound of the lower extremities was done at that time. On review, patient did travel to [place] in June 2017 and developed new back pain in 01/16/2016. Patient was continued on anticoagulation. In Apr-May 2018, she presented to the pulmonary clinic with questions regarding pulmonary embolism management. Additional assessment was conducted that time and results are outlined below.  At the present time, she is feeling well and denies any chest pain, shortness of breath, or cough outside of her baseline symptoms from asthma. She denies any recurrent swelling in the lower extremities. Denies any swelling in the   upper extremities, neck, or face. She has regular menstrual periods every 28-30 days with 5-7 days of bleeding. Menstrual periods are somewhat heavier on blood thinners, but not significantly so.  Oncological/hematological History: --CTA Chest/CT A/P, 05/07/16:  Filling defects in posterior medial right upper lobe vessels and anterior right upper lobe arteries consistent with pulmonary emboli. No pulmonary nodules, mediastinal, or hilar lymphadenopathy. Normal appearance of the liver, retroperitoneum, and spleen. --Labs, 11/13/16: Thrombophilia panel -- Protein S 41%, Protein C Ag 81% & Activity 115%, Beta-2 GP Ab -- negative, ACL IgM -- positive, IgG/IgA -- negative, fV Leiden -- negative, PT gene mutation -- positive for heterozygous [id number] mutation; --VQ Scan, 12/20/16: Very low probability of acute pulmonary embolism --Doppler US BL LE, 01/03/17: no evidence of deep vein thrombosis on either side   Medical History: Past Medical History:  Diagnosis Date  . Allergic asthma   . Allergy to environmental factors     Surgical History: History reviewed. No pertinent surgical history.  Family History: Family History  Problem Relation Age of Onset  . Stroke Mother     Social History: Social History   Social History  . Marital status: Married    Spouse name: N/A  . Number of children: N/A  . Years of education: N/A   Occupational History  . Not on file.   Social History Main Topics  . Smoking status: Former Smoker    Quit date: 07/30/2011  . Smokeless tobacco: Never Used  . Alcohol use Not on file  . Drug use: Unknown  . Sexual activity: Not on file   Other Topics Concern  . Not on file   Social History Narrative  . No narrative on file    Allergies: No Known Allergies  Medications:  Current Outpatient Prescriptions  Medication Sig Dispense Refill  . apixaban (ELIQUIS) 5 MG TABS tablet Take 1 tablet (5 mg total) by mouth 2 (two) times daily. 180 tablet 3  . SYMBICORT 80-4.5 MCG/ACT inhaler INHALE 2 PUFFS BY MOUTH FIRST THING IN THE MORNING AND THEN ANOTHER 2 PUFFS ABOUT 12 HOURS LATER 10.2 g 5   No current facility-administered medications for this visit.     Review of Systems: Review of Systems  All other systems  reviewed and are negative.    PHYSICAL EXAMINATION Blood pressure (!) 117/53, pulse 88, temperature 98.4 F (36.9 C), temperature source Oral, resp. rate 18, height 5' 7" (1.702 m), weight 227 lb 1.6 oz (103 kg), SpO2 97 %.  ECOG PERFORMANCE STATUS: 0 - Asymptomatic  Physical Exam  Constitutional: She is oriented to person, place, and time and well-developed, well-nourished, and in no distress. No distress.  HENT:  Head: Normocephalic and atraumatic.  Mouth/Throat: Oropharynx is clear and moist. No oropharyngeal exudate.  Eyes: Pupils are equal, round, and reactive to light. Conjunctivae and EOM are normal. No scleral icterus.  Neck: Normal range of motion. Neck supple. No thyromegaly present.  Cardiovascular: Normal rate, regular rhythm and normal heart sounds.   No murmur heard. Pulmonary/Chest: Effort normal and breath sounds normal. No respiratory distress. She has no wheezes. She has no rales.  Abdominal: Soft. Bowel sounds are normal. She exhibits no distension and no mass. There is no tenderness. There is no rebound and no guarding.  Musculoskeletal: Normal range of motion. She exhibits no edema or tenderness.  Lymphadenopathy:    She has no cervical adenopathy.  Neurological: She is alert and oriented to person, place, and time. She has normal reflexes. No cranial nerve   deficit.  Skin: Skin is warm and dry. No rash noted. She is not diaphoretic. No erythema.     LABORATORY DATA: I have personally reviewed the data as listed: Appointment on 04/21/2017  Component Date Value Ref Range Status  . WBC 04/21/2017 8.2  3.9 - 10.3 10e3/uL Final  . NEUT# 04/21/2017 5.2  1.5 - 6.5 10e3/uL Final  . HGB 04/21/2017 13.7  11.6 - 15.9 g/dL Final  . HCT 04/21/2017 40.3  34.8 - 46.6 % Final  . Platelets 04/21/2017 228  145 - 400 10e3/uL Final  . MCV 04/21/2017 86.9  79.5 - 101.0 fL Final  . MCH 04/21/2017 29.5  25.1 - 34.0 pg Final  . MCHC 04/21/2017 34.0  31.5 - 36.0 g/dL Final  . RBC  04/21/2017 4.64  3.70 - 5.45 10e6/uL Final  . RDW 04/21/2017 12.9  11.2 - 14.5 % Final  . lymph# 04/21/2017 2.4  0.9 - 3.3 10e3/uL Final  . MONO# 04/21/2017 0.4  0.1 - 0.9 10e3/uL Final  . Eosinophils Absolute 04/21/2017 0.1  0.0 - 0.5 10e3/uL Final  . Basophils Absolute 04/21/2017 0.0  0.0 - 0.1 10e3/uL Final  . NEUT% 04/21/2017 63.2  38.4 - 76.8 % Final  . LYMPH% 04/21/2017 29.7  14.0 - 49.7 % Final  . MONO% 04/21/2017 5.2  0.0 - 14.0 % Final  . EOS% 04/21/2017 1.5  0.0 - 7.0 % Final  . BASO% 04/21/2017 0.4  0.0 - 2.0 % Final  . Sodium 04/21/2017 140  136 - 145 mEq/L Final  . Potassium 04/21/2017 4.0  3.5 - 5.1 mEq/L Final  . Chloride 04/21/2017 105  98 - 109 mEq/L Final  . CO2 04/21/2017 26  22 - 29 mEq/L Final  . Glucose 04/21/2017 85  70 - 140 mg/dl Final   Glucose reference range is for nonfasting patients. Fasting glucose reference range is 70- 100.  Marland Kitchen BUN 04/21/2017 8.0  7.0 - 26.0 mg/dL Final  . Creatinine 04/21/2017 0.7  0.6 - 1.1 mg/dL Final  . Total Bilirubin 04/21/2017 0.82  0.20 - 1.20 mg/dL Final  . Alkaline Phosphatase 04/21/2017 63  40 - 150 U/L Final  . AST 04/21/2017 13  5 - 34 U/L Final  . ALT 04/21/2017 20  0 - 55 U/L Final  . Total Protein 04/21/2017 7.8  6.4 - 8.3 g/dL Final  . Albumin 04/21/2017 3.9  3.5 - 5.0 g/dL Final  . Calcium 04/21/2017 9.4  8.4 - 10.4 mg/dL Final  . Anion Gap 04/21/2017 9  3 - 11 mEq/L Final  . EGFR 04/21/2017 >90  >90 ml/min/1.73 m2 Final   eGFR is calculated using the CKD-EPI Creatinine Equation (2009)  . Anticardiolipin Ab,IgG,Qn 04/21/2017 <9  0 - 14 GPL U/mL Final   Comment:                                          Negative:              <15                                          Indeterminate:     15 - 20  Low-Med Positive: >20 - 80                                          High Positive:         >80   . Anticardiolipin Ab,IgM,Qn 04/21/2017 10  0 - 12 MPL U/mL Final   Comment:                                           Negative:              <13                                          Indeterminate:     13 - 20                                          Low-Med Positive: >20 - 80                                          High Positive:         >80   . Anticardiolipin Ab,IgA,Qn 04/21/2017 <9  0 - 11 APL U/mL Final   Comment:                                          Negative:              <12                                          Indeterminate:     12 - 20                                          Low-Med Positive: >20 - 80                                          High Positive:         >80   . Beta-2 Glycoprotein I Ab, IgG 04/21/2017 <9  0 - 20 GPI IgG units Final   Comment: The reference interval reflects a 3SD or 99th percentile interval, which is thought to represent a potentially clinically significant result in accordance with the International Consensus Statement on the classification criteria for definitive antiphospholipid syndrome (APS). J Thromb Haem 2006;4:295-306.   . Beta-2 Glyco 1 IgA 04/21/2017 <9  0 - 25 GPI IgA units Final   Comment: The reference interval reflects a 3SD or 99th percentile interval, which is thought to represent a potentially clinically significant result in   accordance with the International Consensus Statement on the classification criteria for definitive antiphospholipid syndrome (APS). J Thromb Haem 2006;4:295-306.   . Beta-2 Glyco 1 IgM 04/21/2017 <9  0 - 32 GPI IgM units Final   Comment: The reference interval reflects a 3SD or 99th percentile interval, which is thought to represent a potentially clinically significant result in accordance with the International Consensus Statement on the classification criteria for definitive antiphospholipid syndrome (APS). J Thromb Haem 2006;4:295-306.          Mikhail G Perlov, MD   

## 2017-04-27 ENCOUNTER — Other Ambulatory Visit: Payer: Self-pay | Admitting: Internal Medicine

## 2017-04-28 ENCOUNTER — Encounter: Payer: Managed Care, Other (non HMO) | Admitting: Hematology and Oncology

## 2017-10-24 ENCOUNTER — Inpatient Hospital Stay: Payer: 59 | Attending: Hematology and Oncology | Admitting: Hematology and Oncology

## 2017-10-24 ENCOUNTER — Encounter: Payer: Self-pay | Admitting: Hematology and Oncology

## 2017-10-24 ENCOUNTER — Telehealth: Payer: Self-pay | Admitting: Hematology and Oncology

## 2017-10-24 ENCOUNTER — Inpatient Hospital Stay: Payer: 59

## 2017-10-24 VITALS — BP 105/58 | HR 63 | Temp 97.5°F | Resp 18 | Ht 67.0 in | Wt 227.1 lb

## 2017-10-24 DIAGNOSIS — D6862 Lupus anticoagulant syndrome: Secondary | ICD-10-CM | POA: Diagnosis not present

## 2017-10-24 DIAGNOSIS — Z86718 Personal history of other venous thrombosis and embolism: Secondary | ICD-10-CM | POA: Diagnosis not present

## 2017-10-24 DIAGNOSIS — Z7901 Long term (current) use of anticoagulants: Secondary | ICD-10-CM | POA: Insufficient documentation

## 2017-10-24 DIAGNOSIS — Z86711 Personal history of pulmonary embolism: Secondary | ICD-10-CM | POA: Insufficient documentation

## 2017-10-24 DIAGNOSIS — D6859 Other primary thrombophilia: Secondary | ICD-10-CM

## 2017-10-24 DIAGNOSIS — D6852 Prothrombin gene mutation: Secondary | ICD-10-CM | POA: Diagnosis present

## 2017-10-24 DIAGNOSIS — D6861 Antiphospholipid syndrome: Secondary | ICD-10-CM | POA: Diagnosis not present

## 2017-10-24 NOTE — Telephone Encounter (Signed)
Scheduled appt per 3/29 los - per patient no print out wanted - patient is aware of appt date and time.

## 2017-10-26 LAB — LUPUS ANTICOAGULANT PANEL
DRVVT: 73.9 s — ABNORMAL HIGH (ref 0.0–47.0)
PTT LA: 43.6 s (ref 0.0–51.9)

## 2017-10-26 LAB — DRVVT MIX: dRVVT Mix: 51.1 s — ABNORMAL HIGH (ref 0.0–47.0)

## 2017-10-26 LAB — DRVVT CONFIRM: DRVVT CONFIRM: 1.4 ratio — AB (ref 0.8–1.2)

## 2017-10-27 LAB — CARDIOLIPIN ANTIBODIES, IGG, IGM, IGA
Anticardiolipin IgG: <9 GPL U/mL (ref 0–14)
Anticardiolipin IgM: 14 MPL U/mL — ABNORMAL HIGH (ref 0–12)

## 2017-10-28 LAB — BETA-2-GLYCOPROTEIN I ABS, IGG/M/A
Beta-2-Glycoprotein I IgA: <9 GPI IgA units (ref 0–25)
Beta-2-Glycoprotein I IgM: <9 GPI IgM units (ref 0–32)

## 2017-11-03 DIAGNOSIS — D6861 Antiphospholipid syndrome: Secondary | ICD-10-CM | POA: Insufficient documentation

## 2017-11-03 NOTE — Progress Notes (Signed)
[facility] Cancer Follow-up Visit:  Assessment: Antiphospholipid antibody syndrome [facility](HCC) 37 y.o. 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 The event that potential provocation by a protracted travel with induced immobility in June 2017, but connection is somewhat tenuous. She was initially treated with Apixaban (Eliquis) therapy with complete resolution of previous symptoms. 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   Prothrombin gene mutation by itself places the patient at 2-3 times higher risk of clotting. In addition off the protein S deficiency makes recurrent thrombosis highly likely. With that in mind, I do recommend patient to remain on anticoagulation indefinitely. 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  Repeat testing confirms presence of anticardiolipin antibody as well as positive lupus anticoagulant.  Testing for anti-beta-2 glycoprotein antibodies is negative.  At the present time, patient has 3 identified causes for persistent thrombotic risk and I am confirming my previous recommendation of indefinite anticoagulation.  She was of anticoagulant is somewhat questionable at this time.  Patient appears to be tolerating apixaban without significant difficulties.  Based on the available literature, it can be safely continued at this point in time.  Of note, recent publication by Candice CampVittorio Pengo, Venetia NightGentian Denas, Giacomo Zoppellaro et al in Blood demonstrated inferior outcomes when comparing rivaroxaban to warfarin in patients with high risk antiphospholipid antibody syndrome as defined by triple-positive APLS.   Patient would be considered a "double positive "and does not fit the population of the study, but if she develops  thrombosis while undergoing anticoagulation with apixaban, she should be transitioned to warfarin.  Plan: --Continue Apixaban (Eliquis) indefinitely due to risk of recurrent clotting --RTC 12 months for clinical monitioring   Voice recognition software was used and creation of this note. Despite my best effort at editing the text, some misspelling/errors may have occurred.  No orders of the defined types were placed in this encounter.   Cancer Staging No matching staging information was found for the patient.  All questions were answered. . The patient knows to call the clinic with any problems, questions or concerns.  This note was electronically signed.    History of Presenting Illness Erica Ortega is a 37 y.o. female followed [facility] the [facility] for Recommendations regarding anticoagulation management for her history DVT and PE. Patient initially presented with shortness of breath left-sided chest pain in October 2017 to emergency room in [place]. Imaging and time demonstrates presence of pulmonary emboli in the right upper lobe and right lower lobe of the lungs without evidence of right ventricular strain. Patient was started on Apixaban (Eliquis) leading to complete symptom resolution within a couple weeks. No ultrasound of the lower extremities was done at that time. On review, patient did travel to [place] in June 2017 and developed new back pain in 01/16/2016. Patient was continued on anticoagulation. In Apr-May 2018, she presented to the pulmonary clinic with questions regarding pulmonary embolism management. Additional assessment was conducted that time and results are outlined below.  At the present time, she is feeling well and denies any chest pain, shortness of breath, or cough outside of her baseline symptoms from asthma. She denies any recurrent swelling in the lower extremities. Denies any swelling in the upper extremities, neck, or face. She has regular menstrual  periods every 28-30 days with 5-7 days of bleeding. Menstrual  periods are somewhat heavier on blood thinners, but not significantly so.  Oncological/hematological History: **Antiphospholipid antibody syndrome: --CTA Chest/CT A/P, 05/07/16: Filling defects in posterior medial right upper lobe vessels and anterior right upper lobe arteries consistent with pulmonary emboli. No pulmonary nodules, mediastinal, or hilar lymphadenopathy. Normal appearance of the liver, retroperitoneum, and spleen. --Labs, 11/13/16: Thrombophilia panel -- Protein S 41%, Protein C Ag 81% & Activity 115%, Beta-2 GP Ab -- negative, ACL IgM -- positive, IgG/IgA -- negative, fV Leiden -- negative, PT gene mutation -- positive for heterozygous [id number] mutation; --VQ Scan, 12/20/16: Very low probability of acute pulmonary embolism --Doppler US BL LE, 01/03/17: no evidence of deep vein thrombosis on either side --Labs, 10/24/17: Positive for ACL IgM -- 14, DRVVT 73.9, confirm 1.4 & negative for other abnormalities;    No history exists.    Medical History: Past Medical History:  Diagnosis Date  . Allergic asthma   . Allergy to environmental factors     Surgical History: History reviewed. No pertinent surgical history.  Family History: Family History  Problem Relation Age of Onset  . Stroke Mother     Social History: Social History   Socioeconomic History  . Marital status: Married    Spouse name: Not on file  . Number of children: Not on file  . Years of education: Not on file  . Highest education level: Not on file  Occupational History  . Not on file  Social Needs  . Financial resource strain: Not on file  . Food insecurity:    Worry: Not on file    Inability: Not on file  . Transportation needs:    Medical: Not on file    Non-medical: Not on file  Tobacco Use  . Smoking status: Former Smoker    Last attempt to quit: 07/30/2011    Years since quitting: 6.2  . Smokeless tobacco: Never Used   Substance and Sexual Activity  . Alcohol use: No  . Drug use: No  . Sexual activity: Never  Lifestyle  . Physical activity:    Days per week: Not on file    Minutes per session: Not on file  . Stress: Not on file  Relationships  . Social connections:    Talks on phone: Not on file    Gets together: Not on file    Attends religious service: Not on file    Active member of club or organization: Not on file    Attends meetings of clubs or organizations: Not on file    Relationship status: Not on file  . Intimate partner violence:    Fear of current or ex partner: Not on file    Emotionally abused: Not on file    Physically abused: Not on file    Forced sexual activity: Not on file  Other Topics Concern  . Not on file  Social History Narrative  . Not on file    Allergies: No Known Allergies  Medications:  Current Outpatient Medications  Medication Sig Dispense Refill  . apixaban (ELIQUIS) 5 MG TABS tablet Take 1 tablet (5 mg total) by mouth 2 (two) times daily. 180 tablet 3  . SYMBICORT 80-4.5 MCG/ACT inhaler INHALE 2 PUFFS BY MOUTH FIRST THING IN THE MORNING AND THEN ANOTHER 2 PUFFS ABOUT 12 HOURS LATER 10.2 g 0   No current facility-administered medications for this visit.     Review of Systems: Review of Systems  All other systems reviewed and are negative.    PHYSICAL  EXAMINATION Blood pressure (!) 105/58, pulse 63, temperature (!) 97.5 F (36.4 C), temperature source Oral, resp. rate 18, height 5\' 7"  (1.702 m), weight 227 lb 1.6 oz (103 kg), SpO2 98 %.  ECOG PERFORMANCE STATUS: 0 - Asymptomatic  Physical Exam  Constitutional: She is oriented to person, place, and time and well-developed, well-nourished, and in no distress. No distress.  HENT:  Head: Normocephalic and atraumatic.  Mouth/Throat: Oropharynx is clear and moist. No oropharyngeal exudate.  Eyes: Pupils are equal, round, and reactive to light. Conjunctivae and EOM are normal. No scleral icterus.   Neck: Normal range of motion. Neck supple. No thyromegaly present.  Cardiovascular: Normal rate, regular rhythm and normal heart sounds.  No murmur heard. Pulmonary/Chest: Effort normal and breath sounds normal. No respiratory distress. She has no wheezes. She has no rales.  Abdominal: Soft. Bowel sounds are normal. She exhibits no distension and no mass. There is no tenderness. There is no rebound and no guarding.  Musculoskeletal: Normal range of motion. She exhibits no edema or tenderness.  Lymphadenopathy:    She has no cervical adenopathy.  Neurological: She is alert and oriented to person, place, and time. She has normal reflexes. No cranial nerve deficit.  Skin: Skin is warm and dry. No rash noted. She is not diaphoretic. No erythema.     LABORATORY DATA: I have personally reviewed the data as listed: Clinical Support on 10/24/2017  Component Date Value Ref Range Status  . Beta-2 Glyco I IgG 10/24/2017 <9  0 - 20 GPI IgG units Final   Comment: (NOTE) The reference interval reflects a 3SD or 99th percentile interval, which is thought to represent a potentially clinically significant result in accordance with the International Consensus Statement on the classification criteria for definitive antiphospholipid syndrome (APS). J Thromb Haem 2006;4:295-306.   . Beta-2-Glycoprotein I IgM 10/24/2017 <9  0 - 32 GPI IgM units Final   Comment: (NOTE) The reference interval reflects a 3SD or 99th percentile interval, which is thought to represent a potentially clinically significant result in accordance with the International Consensus Statement on the classification criteria for definitive antiphospholipid syndrome (APS). J Thromb Haem 2006;4:295-306. Performed At: [facility] [address] [place], [place] [id number] Jolene Schimke MD [id number]   . Beta-2-Glycoprotein I IgA 10/24/2017 <9  0 - 25 GPI IgA units Final   Comment: (NOTE) The reference interval reflects a  3SD or 99th percentile interval, which is thought to represent a potentially clinically significant result in accordance with the International Consensus Statement on the classification criteria for definitive antiphospholipid syndrome (APS). J Thromb Haem 2006;4:295-306. Performed at [facility], [address], [place], [place] [address]   . Anticardiolipin IgG 10/24/2017 <9  0 - 14 GPL U/mL Final   Comment: (NOTE)                          Negative:              <15                          Indeterminate:     15 - 20                          Low-Med Positive: >20 - 80  High Positive:         >80   . Anticardiolipin IgM 10/24/2017 14* 0 - 12 MPL U/mL Final   Comment: (NOTE)                          Negative:              <13                          Indeterminate:     13 - 20                          Low-Med Positive: >20 - 80                          High Positive:         >80   . Anticardiolipin IgA 10/24/2017 <9  0 - 11 APL U/mL Final   Comment: (NOTE)                          Negative:              <12                          Indeterminate:     12 - 20                          Low-Med Positive: >20 - 80                          High Positive:         >80 Performed At: [facility] [address] [place], [place] [id number] Jolene Schimke MD [id number] Performed at [facility], [address] [place], [place] [address]   . PTT Lupus Anticoagulant 10/24/2017 43.6  0.0 - 51.9 sec Final  . DRVVT 10/24/2017 73.9* 0.0 - 47.0 sec Final  . Lupus Anticoag Interp 10/24/2017 Comment:   Corrected   Comment: (NOTE) Results are consistent with the presence of a lupus anticoagulant. NOTE: Only persistent lupus anticoagulants are thought to be of clinical significance. For this reason, repeat testing in 12 or more weeks after an initial positive result should be considered to confirm or refute  the presence of a lupus anticoagulant, depending on clinical presentation. Results of lupus anticoagulant tests may be falsely positive in the presence of certain anticoagulant therapies. Performed At: [facility] [address] [place], [place] [id number] Jolene Schimke MD [id number] Performed at [facility], [address], [place], [place] [address]   . dRVVT Mix 10/24/2017 51.1* 0.0 - 47.0 sec Final   Comment: (NOTE) Performed At: [facility] [address] [place], [place] [id number] Jolene Schimke MD [id number] Performed at [facility], [address] [place], [place] [address]   . dRVVT Confirm 10/24/2017 1.4* 0.8 - 1.2 ratio Final   Comment: (NOTE) Performed At: [facility] [address] [place], [place] [id number] Jolene Schimke MD [id number] Performed at [facility], [address], [place], [place] [address]        Daisy Blossom, MD

## 2017-11-03 NOTE — Assessment & Plan Note (Signed)
37 y.o. 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 The event that potential provocation by a protracted travel with induced immobility in June 2017, but connection is somewhat tenuous. She was initially treated with Apixaban (Eliquis) therapy with complete resolution of previous symptoms. 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   Prothrombin gene mutation by itself places the patient at 2-3 times higher risk of clotting. In addition off the protein S deficiency makes recurrent thrombosis highly likely. With that in mind, I do recommend patient to remain on anticoagulation indefinitely. 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  Repeat testing confirms presence of anticardiolipin antibody as well as positive lupus anticoagulant.  Testing for anti-beta-2 glycoprotein antibodies is negative.  At the present time, patient has 3 identified causes for persistent thrombotic risk and I am confirming my previous recommendation of indefinite anticoagulation.  She was of anticoagulant is somewhat questionable at this time.  Patient appears to be tolerating apixaban without significant difficulties.  Based on the available literature, it can be safely continued at this point in time.  Of note, recent publication by Candice CampVittorio Pengo, Venetia NightGentian Denas, Giacomo Zoppellaro et al in Blood demonstrated inferior outcomes when comparing rivaroxaban to warfarin in patients with high risk antiphospholipid antibody syndrome as defined by triple-positive APLS.   Patient would be considered a "double positive "and does not fit the population of the study, but if she develops thrombosis while undergoing anticoagulation with apixaban, she should be transitioned to  warfarin.  Plan: --Continue Apixaban (Eliquis) indefinitely due to risk of recurrent clotting --RTC 12 months for clinical monitioring

## 2017-11-04 ENCOUNTER — Other Ambulatory Visit: Payer: Self-pay

## 2017-11-04 MED ORDER — APIXABAN 5 MG PO TABS: 5.0000 mg | ORAL_TABLET | Freq: Two times a day (BID) | ORAL | 3 refills | Status: DC

## 2018-04-08 ENCOUNTER — Telehealth: Payer: Self-pay | Admitting: Internal Medicine

## 2018-04-08 ENCOUNTER — Other Ambulatory Visit: Payer: Self-pay | Admitting: Internal Medicine

## 2018-04-08 MED ORDER — BUDESONIDE-FORMOTEROL FUMARATE 80-4.5 MCG/ACT IN AERO: INHALATION_SPRAY | RESPIRATORY_TRACT | 0 refills | Status: AC

## 2018-04-08 NOTE — Telephone Encounter (Signed)
Pt is calling back [phone]

## 2018-04-08 NOTE — Telephone Encounter (Signed)
Called patient unable to reach left message to give us a call back.

## 2018-04-08 NOTE — Telephone Encounter (Signed)
Spoke with patient-aware that Rx has been sent to pharmacy as requested and will call PCP for follow up or set up yearly appt with MW. Nothing more needed at this time.

## 2018-04-08 NOTE — Telephone Encounter (Signed)
12/20/16 AVS:  Instructions   Stay on Eliquis 5 mg twice daily   Please see patient coordinator before you leave today  to schedule venous dopplers and [place] same day prior to January 26 2017    If you are satisfied with your treatment plan,  let your doctor know and he/she can either refill your medications or you can return here when your prescription runs out.     If in any way you are not 100% satisfied,  please tell us.  If 100% better, tell your friends!  Pulmonary follow up is as needed         MW please advise if okay to refill Symbicort this time for patient. Thanks.   Pt is set to fly out tomorrow morning.

## 2018-04-08 NOTE — Telephone Encounter (Signed)
Ok for 3 months refills but needs to return here or she should ask her PCP to do it going forward (which I prefer if doing great vs return her if not doing great)

## 2018-05-21 ENCOUNTER — Telehealth: Payer: Self-pay | Admitting: *Deleted

## 2018-05-21 ENCOUNTER — Telehealth: Payer: Self-pay

## 2018-05-21 NOTE — Telephone Encounter (Signed)
10/23 10:55: Received call from [facility] in registration. Per Melissa: patient had left VM with her asking if her March appt could be scheduled earlier because she thought she might have a clot. Melissa had called Dr. Candise Che who had stated pt should go to ED and/or contact PCP and asked her to call pt.  Melissa asked that this writer call patient with information as it was considered medical advice which she was not qualified to give. 10/23 1107: Called patient @ [phone] (H & M) Left generic message as VM not identified. Identified self as calling from MD office and asked recipient to contact office for advisement.

## 2018-05-21 NOTE — Telephone Encounter (Signed)
Pt left message with Melissa in registration 05/20/18 at 1055 regarding wanting to be seen sooner. Pt f/u appt is scheduled for March and she would like to be seen sooner. Pt wanted Dr. Candise Che to be aware that she might have a clot and wanted DR. Kale to be made aware. Through discussion with Dr. Candise Che, pt to go to the ED or see PCP for evaluation. LVM on pt mobile phone to go to the ED if necessary d/t concern regarding potential clot. If non-urgent, pt to go to her PCP for evaluation. Pt encouraged to call office and LVM to update.

## 2018-06-01 NOTE — Telephone Encounter (Signed)
Patient left message on voicemail Friday 11/1 4:45 pm requesting sooner appointment for her blood. See previous nurse's notes. Patient requesting call back and can be reached at either of the numbers listed in Epic.

## 2018-06-04 ENCOUNTER — Telehealth: Payer: Self-pay | Admitting: *Deleted

## 2018-06-04 NOTE — Telephone Encounter (Signed)
Contacted patient in response to message forwarded to office by scheduling. Patient had stated she was patient of Dr. Gweneth Dimitri and wanted to know if she would be seeing Dr. Candise Che for follow up with Blood thinner. Per Dr. Almetta Lovely note 10/24/17, patient to be seen in 12 months.  Informed patient she had appointment scheduled 10/23/18 at 10:30 with  Dr. Candise Che. Advised patient that she could also follow up with PCP if she chooses and that PCP was welcome to contact Dr. Candise Che for coordination of care. Also advised to seek emergency care if needed if concerned about blood clot. Patient verbalized understanding and thanked caller. Stated she would be here in March for appointment with Dr.Kale.

## 2018-06-25 IMAGING — CR DG CHEST 2V
3 series · 3 of 3 positions shown · non-contrast
Comparison: None.

CLINICAL DATA: History of blood clots and asthma

EXAM:
CHEST  2 VIEW

[w chest pa]
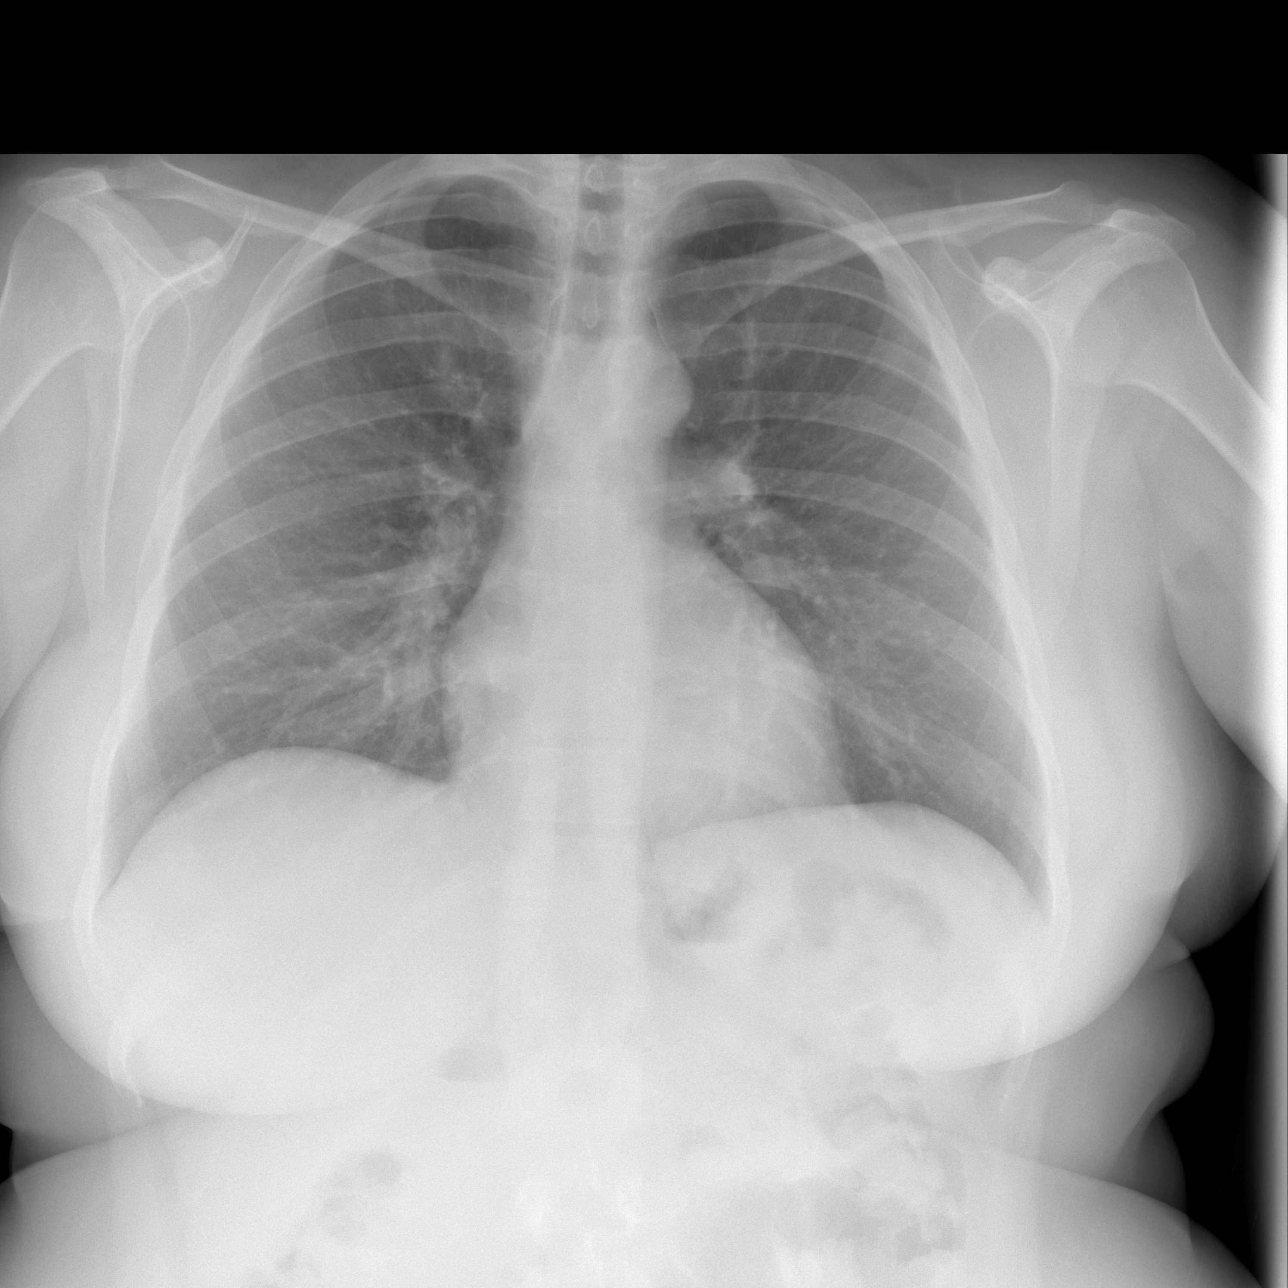

[w chest lat (1 of 2)]
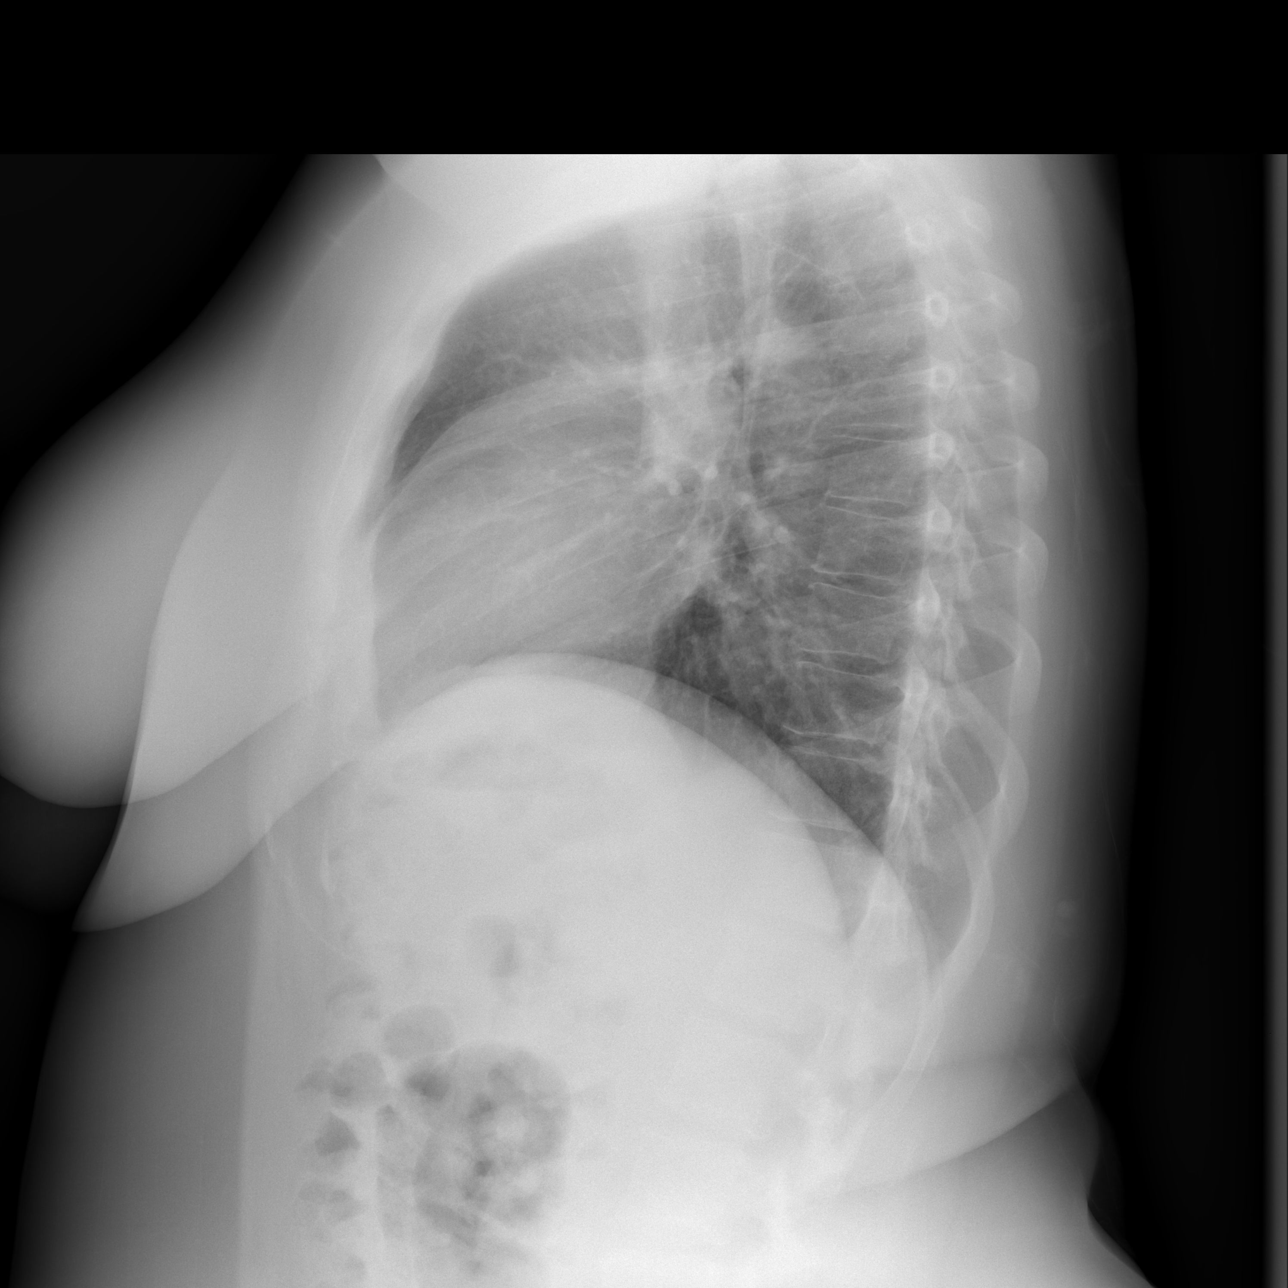

[w chest lat (2 of 2)]
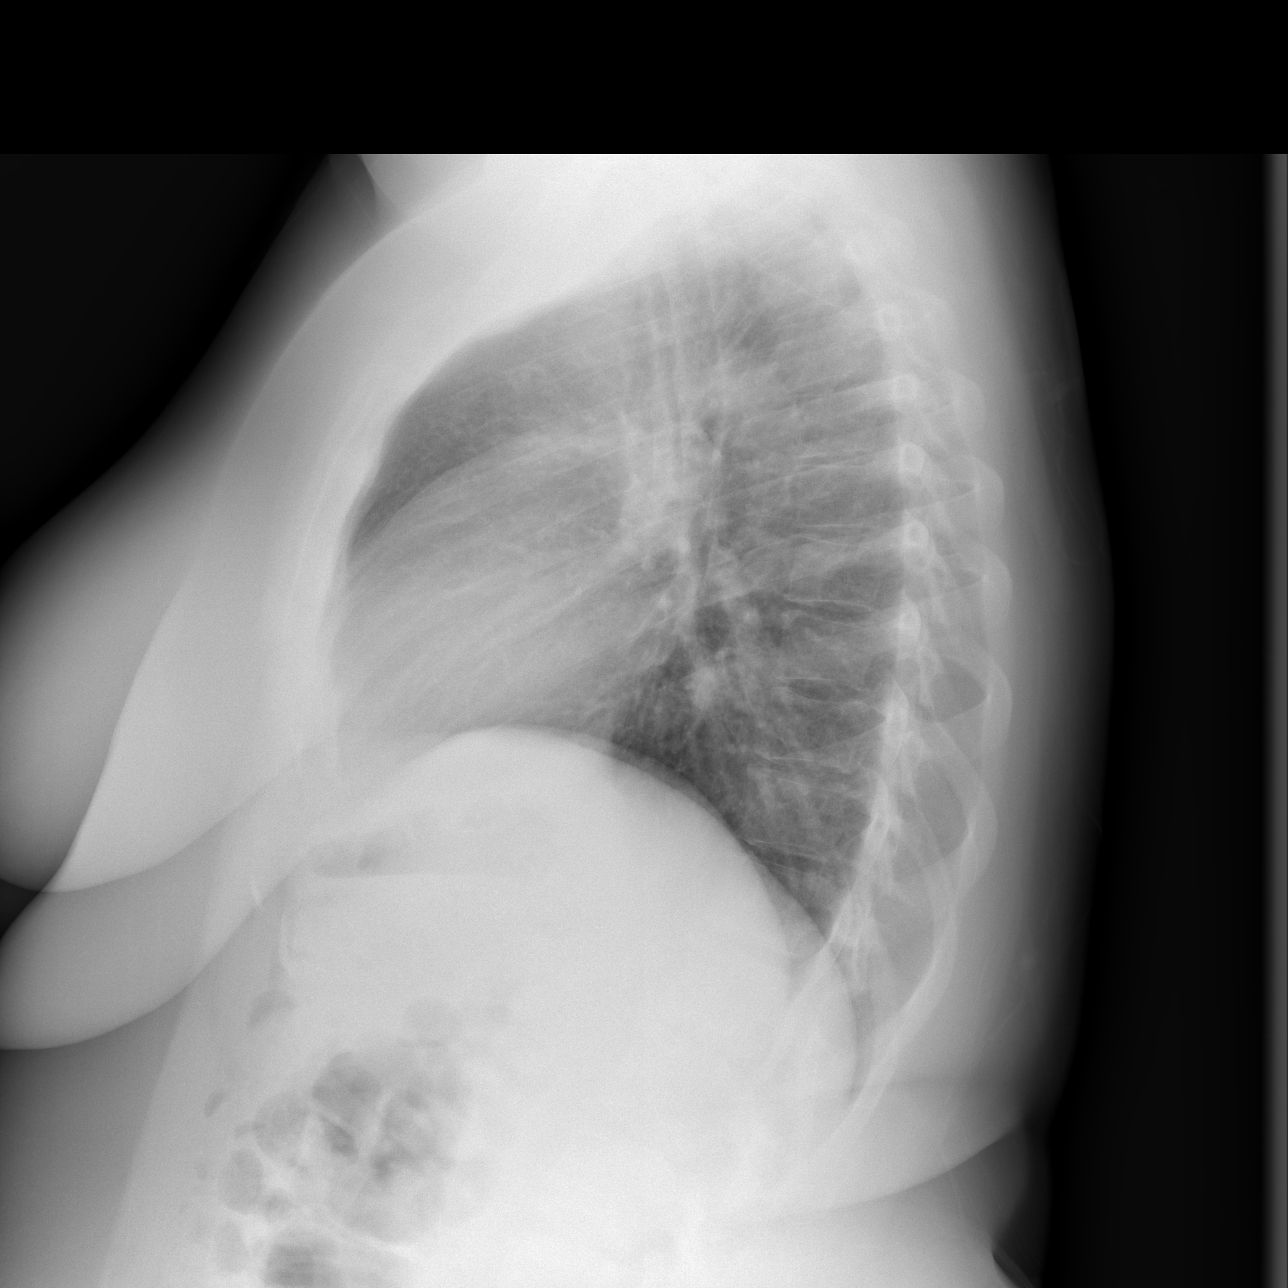

[3 of 3 positions shown; findings below may reference images not displayed]

FINDINGS: The heart size and mediastinal contours are within normal limits.
Both lungs are clear. The visualized skeletal structures are
unremarkable.
IMPRESSION: No active cardiopulmonary disease.

## 2018-09-25 ENCOUNTER — Telehealth: Payer: Self-pay | Admitting: Hematology

## 2018-09-25 NOTE — Telephone Encounter (Signed)
Former [employer] patient. Left message confirming 3/27 appointment with GK.

## 2018-10-02 ENCOUNTER — Other Ambulatory Visit: Payer: Self-pay | Admitting: Hematology

## 2018-10-02 ENCOUNTER — Telehealth: Payer: Self-pay | Admitting: *Deleted

## 2018-10-02 ENCOUNTER — Other Ambulatory Visit: Payer: Self-pay | Admitting: *Deleted

## 2018-10-02 MED ORDER — APIXABAN 5 MG PO TABS: 5.0000 mg | ORAL_TABLET | Freq: Two times a day (BID) | ORAL | 0 refills | Status: AC

## 2018-10-02 NOTE — Telephone Encounter (Signed)
Patient saw Dr. Gweneth Dimitri (past MD at [facility]). Was prescribed Eliquis for a year. Has appointment with Dr. Candise Che for 3/27, but will run out of medicaton this weekend. Requesting enough medication prescribed to last until appt on 3/27. Message given to Dr. Candise Che.

## 2018-10-02 NOTE — Telephone Encounter (Signed)
Patient called - requested refill of Eliquis. Has appt 3/27 with Dr. Candise Che (previously saw Dr. Gweneth Dimitri), but med will run out 2 weeks prior to appt. Refill request sent to Dr. Candise Che

## 2018-10-22 ENCOUNTER — Telehealth: Payer: Self-pay | Admitting: *Deleted

## 2018-10-22 NOTE — Telephone Encounter (Signed)
Patient cancelled appt for 3/27 and will f/u with PCP. Dr. Candise Che in agreement. Patient will contact office in future if another appt is desired.

## 2018-10-23 ENCOUNTER — Ambulatory Visit: Payer: 59 | Admitting: Hematology
